# Patient Record
Sex: Female | Born: 1985 | Hispanic: Yes | Marital: Single | Smoking: Never smoker
Health system: Southern US, Community
[De-identification: ages and names within clinical notes are randomized; demographics above are authoritative.]

## PROBLEM LIST (undated history)

## (undated) DIAGNOSIS — Z789 Other specified health status: Secondary | ICD-10-CM

## (undated) HISTORY — PX: COLPOSCOPY: SHX161

---

## 2006-12-25 ENCOUNTER — Inpatient Hospital Stay (HOSPITAL_COMMUNITY): Admission: AD | Admit: 2006-12-25 | Discharge: 2006-12-25 | Payer: Self-pay | Admitting: Obstetrics & Gynecology

## 2007-04-04 ENCOUNTER — Ambulatory Visit (HOSPITAL_COMMUNITY): Admission: RE | Admit: 2007-04-04 | Discharge: 2007-04-04 | Payer: Self-pay | Admitting: Obstetrics & Gynecology

## 2007-05-26 ENCOUNTER — Ambulatory Visit: Payer: Self-pay | Admitting: Obstetrics & Gynecology

## 2007-08-20 ENCOUNTER — Ambulatory Visit: Payer: Self-pay | Admitting: Family

## 2007-08-20 ENCOUNTER — Inpatient Hospital Stay (HOSPITAL_COMMUNITY): Admission: AD | Admit: 2007-08-20 | Discharge: 2007-08-22 | Payer: Self-pay | Admitting: Obstetrics & Gynecology

## 2007-08-25 ENCOUNTER — Ambulatory Visit: Payer: Self-pay | Admitting: Family Medicine

## 2007-08-25 DIAGNOSIS — O8823 Thromboembolism in the puerperium: Secondary | ICD-10-CM | POA: Insufficient documentation

## 2007-08-25 LAB — CONVERTED CEMR LAB: INR: 2.4

## 2007-09-01 ENCOUNTER — Encounter: Payer: Self-pay | Admitting: Family Medicine

## 2010-02-06 ENCOUNTER — Emergency Department (HOSPITAL_COMMUNITY)
Admission: EM | Admit: 2010-02-06 | Discharge: 2010-02-07 | Payer: Self-pay | Source: Home / Self Care | Admitting: Emergency Medicine

## 2010-12-04 LAB — POCT URINALYSIS DIP (DEVICE)
Hgb urine dipstick: NEGATIVE
Ketones, ur: NEGATIVE
Nitrite: NEGATIVE
Protein, ur: NEGATIVE
Urobilinogen, UA: 0.2

## 2010-12-07 LAB — CBC
HCT: 37.7
HCT: 41.3
Hemoglobin: 12.8
MCHC: 33.5
MCHC: 33.9
MCV: 81
Platelets: 215
Platelets: 225
RBC: 4.65
RDW: 16.6 — ABNORMAL HIGH
WBC: 8.6

## 2010-12-07 LAB — PROTIME-INR: Prothrombin Time: 12.3

## 2010-12-20 LAB — CBC
HCT: 41.4
Hemoglobin: 14.4
Platelets: 359
RDW: 13.3
WBC: 9.3

## 2010-12-20 LAB — URINE MICROSCOPIC-ADD ON

## 2010-12-20 LAB — URINALYSIS, ROUTINE W REFLEX MICROSCOPIC
Hgb urine dipstick: NEGATIVE
Ketones, ur: 15 — AB
Protein, ur: 30 — AB
Urobilinogen, UA: 1

## 2010-12-20 LAB — RPR: RPR Ser Ql: NONREACTIVE

## 2010-12-20 LAB — HCG, QUANTITATIVE, PREGNANCY: hCG, Beta Chain, Quant, S: 40289 — ABNORMAL HIGH

## 2010-12-20 LAB — WET PREP, GENITAL
Trich, Wet Prep: NONE SEEN
Yeast Wet Prep HPF POC: NONE SEEN

## 2010-12-20 LAB — POCT PREGNANCY, URINE: Operator id: 134651

## 2010-12-20 LAB — GC/CHLAMYDIA PROBE AMP, GENITAL: GC Probe Amp, Genital: NEGATIVE

## 2014-07-20 ENCOUNTER — Other Ambulatory Visit: Payer: Self-pay | Admitting: Nurse Practitioner

## 2014-07-20 DIAGNOSIS — N6310 Unspecified lump in the right breast, unspecified quadrant: Secondary | ICD-10-CM

## 2014-07-23 ENCOUNTER — Other Ambulatory Visit: Payer: Self-pay

## 2014-08-02 ENCOUNTER — Other Ambulatory Visit (HOSPITAL_COMMUNITY): Payer: Self-pay | Admitting: *Deleted

## 2014-08-02 DIAGNOSIS — N6310 Unspecified lump in the right breast, unspecified quadrant: Secondary | ICD-10-CM

## 2014-08-19 ENCOUNTER — Ambulatory Visit
Admission: RE | Admit: 2014-08-19 | Discharge: 2014-08-19 | Disposition: A | Discharge status: Home / Self Care | Payer: No Typology Code available for payment source | Source: Ambulatory Visit | Attending: Obstetrics and Gynecology | Admitting: Obstetrics and Gynecology

## 2014-08-19 ENCOUNTER — Ambulatory Visit (HOSPITAL_COMMUNITY)
Admission: RE | Admit: 2014-08-19 | Discharge: 2014-08-19 | Disposition: A | Discharge status: Home / Self Care | Payer: Self-pay | Source: Ambulatory Visit | Attending: Obstetrics and Gynecology | Admitting: Obstetrics and Gynecology

## 2014-08-19 ENCOUNTER — Encounter (HOSPITAL_COMMUNITY): Payer: Self-pay

## 2014-08-19 VITALS — BP 100/62 | Temp 98.9°F | Ht 60.0 in | Wt 122.0 lb

## 2014-08-19 DIAGNOSIS — N6310 Unspecified lump in the right breast, unspecified quadrant: Secondary | ICD-10-CM

## 2014-08-19 DIAGNOSIS — Z1239 Encounter for other screening for malignant neoplasm of breast: Secondary | ICD-10-CM

## 2014-08-19 DIAGNOSIS — N6311 Unspecified lump in the right breast, upper outer quadrant: Secondary | ICD-10-CM

## 2014-08-19 NOTE — Progress Notes (Signed)
CLINIC:  [facility] [employer]) Clinic  REASON FOR VISIT: Well-woman exam and diagnostic mammogram.    HISTORY OF PRESENT ILLNESS:  Ms. Tabitha Shepard is a 29 y.o. female who presents to the [facility] today for clinical breast exam. About 2 months ago, she noted a mass in the upper outer quadrant of the right breast.  There is no associated pain, nipple discharge, redness, or skin changes noted to either breast.  No family history of breast cancer.  Her last pap smear was in 07/2014 and was negative.  She does have a history of an abnormal pap smear with colposcopy about 3 years ago.    REVIEW OF SYSTEMS:  Patient self-reported right breast lump per HPI.  No other breast concerns in either breast.   ALLERGIES: No Known Allergies  CURRENT MEDICATIONS:  No current outpatient prescriptions on file prior to encounter.   No current facility-administered medications on file prior to encounter.      PHYSICAL EXAM:  Vitals:  Filed Vitals:   08/19/14 1324  BP: 100/62  Temp: 98.9 F (37.2 C)   General: Well-nourished, well-appearing female in no acute distress.  She is unaccompanied in clinic today.  Tabitha Bang, LPN and Tabitha Shepard, Spanish language interpreter were present during physical exam for this patient.  Breasts: Bilateral breasts exposed and observed with patient standing (arms at side, arms on hips, arms on hips flexed forward, and arms over head).  No gross abnormalities including breast skin puckering or dimpling noted on observation.  Breasts symmetrical without evidence of skin redness, thickening, or peau d'orange appearance. No nipple retraction or nipple discharge noted bilaterally.  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  Normal fibrocystic changes noted in bilateral breasts.  There is a very small, non-discrete, sub-centimeter linear area in the right breast at 10 o'clock, about 6 cm from nipple. No additional  breast nodularity palpated in bilateral breasts.   Axillary lymph nodes: No axillary lymphadenopathy bilaterally.   GU: Exam deferred. Pap smear is up-to-date.  ASSESSMENT & PLAN:   1. Breast cancer screening: Tabitha Shepard has a likely benign finding in the right breast on her clinical breast exam today.  This likely represents normal fibrocystic changes or a fatty deposit.  There is a low suspicion for cancer regarding this area in the right breast. However, she will receive her diagnostic mammogram as scheduled.  She will be contracted by the imaging center for results of the mammogram, either by letter or phone within the next few weeks.  She was given instructions and educational materials regarding breast self-awareness. Tabitha Shepard is aware of this plan and agrees with it. .    Tabitha Shepard was encouraged to ask questions and all questions were answered to her satisfaction.    Lubertha Basque, NP [facility]  [phone]

## 2014-09-03 ENCOUNTER — Encounter (HOSPITAL_COMMUNITY): Payer: Self-pay | Admitting: *Deleted

## 2014-09-06 ENCOUNTER — Encounter (HOSPITAL_COMMUNITY): Payer: Self-pay | Admitting: *Deleted

## 2016-03-01 LAB — OB RESULTS CONSOLE HEPATITIS B SURFACE ANTIGEN: HEP B S AG: NEGATIVE

## 2016-03-01 LAB — OB RESULTS CONSOLE HIV ANTIBODY (ROUTINE TESTING): HIV: NONREACTIVE

## 2016-03-01 LAB — OB RESULTS CONSOLE RPR: RPR: NONREACTIVE

## 2016-03-01 LAB — OB RESULTS CONSOLE RUBELLA ANTIBODY, IGM: RUBELLA: IMMUNE

## 2016-03-01 LAB — OB RESULTS CONSOLE ABO/RH: RH TYPE: POSITIVE

## 2016-03-02 ENCOUNTER — Other Ambulatory Visit (HOSPITAL_COMMUNITY): Payer: Self-pay | Admitting: Nurse Practitioner

## 2016-03-02 DIAGNOSIS — Z3A12 12 weeks gestation of pregnancy: Secondary | ICD-10-CM

## 2016-03-02 DIAGNOSIS — Z3682 Encounter for antenatal screening for nuchal translucency: Secondary | ICD-10-CM

## 2016-03-12 NOTE — L&D Delivery Note (Signed)
Patient is 31 y.o. [id number] 3719w1d admitted for PROM. S/p IOL with foley bulb, followed by Pitocin.   Prenatal course uncomplicated.  Delivery Note At 4:13 PM a viable female was delivered via VBAC, Spontaneous (Presentation: CephalicROA ;  ).  APGAR: 9, 9; weight Pending.   Placenta status: intact , spontaneous .  Cord: 3 vessel with the following complications: .   Anesthesia:  Epidural Episiotomy: None Lacerations: None Suture Repair: None Est. Blood Loss (mL): 100  Mom to postpartum.  Baby to Couplet care / Skin to Skin.  Denyse AmassCorey P Terin Dierolf 10/20/2016, 4:30 PM     Upon arrival patient was complete and pushing. She pushed with good maternal effort to deliver a viable Female infant in cephalic, ROA position. Nuchal cord present x1, easily reduced. Baby delivered without difficulty, was noted to have good tone and place on maternal abdomen for oral suctioning, drying and stimulation. Delayed cord clamping performed. Placenta delivered spontaneously with gentle cord traction. Fundus firm with massage and Pitocin. Perineum inspected and found to have no laceration Counts of sharps, instruments, and lap pads were all correct.

## 2016-03-30 ENCOUNTER — Encounter (HOSPITAL_COMMUNITY): Payer: Self-pay | Admitting: Nurse Practitioner

## 2016-04-09 ENCOUNTER — Encounter (HOSPITAL_COMMUNITY): Payer: Self-pay | Admitting: *Deleted

## 2016-04-10 ENCOUNTER — Other Ambulatory Visit (HOSPITAL_COMMUNITY): Payer: Self-pay | Admitting: Nurse Practitioner

## 2016-04-10 ENCOUNTER — Encounter (HOSPITAL_COMMUNITY): Payer: Self-pay

## 2016-04-10 ENCOUNTER — Ambulatory Visit (HOSPITAL_COMMUNITY)
Admission: RE | Admit: 2016-04-10 | Discharge: 2016-04-10 | Disposition: A | Discharge status: Home / Self Care | Payer: No Typology Code available for payment source | Source: Ambulatory Visit | Attending: Nurse Practitioner | Admitting: Nurse Practitioner

## 2016-04-10 DIAGNOSIS — Z3A12 12 weeks gestation of pregnancy: Secondary | ICD-10-CM | POA: Insufficient documentation

## 2016-04-10 DIAGNOSIS — Z3682 Encounter for antenatal screening for nuchal translucency: Secondary | ICD-10-CM

## 2016-04-10 DIAGNOSIS — O34211 Maternal care for low transverse scar from previous cesarean delivery: Secondary | ICD-10-CM | POA: Insufficient documentation

## 2016-04-10 DIAGNOSIS — O34219 Maternal care for unspecified type scar from previous cesarean delivery: Secondary | ICD-10-CM

## 2016-04-10 HISTORY — DX: Other specified health status: Z78.9

## 2016-04-13 ENCOUNTER — Other Ambulatory Visit: Payer: Self-pay

## 2016-09-24 LAB — OB RESULTS CONSOLE GBS: GBS: NEGATIVE

## 2016-09-24 LAB — OB RESULTS CONSOLE GC/CHLAMYDIA
CHLAMYDIA, DNA PROBE: NEGATIVE
Gonorrhea: NEGATIVE

## 2016-10-19 ENCOUNTER — Inpatient Hospital Stay (HOSPITAL_COMMUNITY)
Admission: AD | Admit: 2016-10-19 | Discharge: 2016-10-21 | DRG: 775 | Disposition: A | Discharge status: Home / Self Care | Payer: Medicaid Other | Source: Ambulatory Visit | Attending: Family Medicine | Admitting: Family Medicine

## 2016-10-19 ENCOUNTER — Encounter (HOSPITAL_COMMUNITY): Payer: Self-pay | Admitting: *Deleted

## 2016-10-19 DIAGNOSIS — O34211 Maternal care for low transverse scar from previous cesarean delivery: Secondary | ICD-10-CM | POA: Diagnosis present

## 2016-10-19 DIAGNOSIS — Z3A40 40 weeks gestation of pregnancy: Secondary | ICD-10-CM | POA: Diagnosis not present

## 2016-10-19 DIAGNOSIS — O4202 Full-term premature rupture of membranes, onset of labor within 24 hours of rupture: Secondary | ICD-10-CM | POA: Diagnosis present

## 2016-10-19 LAB — CBC
HEMATOCRIT: 40.1 % (ref 36.0–46.0)
HEMOGLOBIN: 13.4 g/dL (ref 12.0–15.0)
MCH: 27.9 pg (ref 26.0–34.0)
MCHC: 33.4 g/dL (ref 30.0–36.0)
MCV: 83.4 fL (ref 78.0–100.0)
Platelets: 225 10*3/uL (ref 150–400)
RBC: 4.81 MIL/uL (ref 3.87–5.11)
RDW: 16.4 % — ABNORMAL HIGH (ref 11.5–15.5)
WBC: 8.2 10*3/uL (ref 4.0–10.5)

## 2016-10-19 LAB — TYPE AND SCREEN
ABO/RH(D): A POS
ANTIBODY SCREEN: NEGATIVE

## 2016-10-19 LAB — AMNISURE RUPTURE OF MEMBRANE (ROM) NOT AT ARMC: AMNISURE: POSITIVE

## 2016-10-19 LAB — POCT FERN TEST: POCT Fern Test: NEGATIVE

## 2016-10-19 MED ORDER — OXYTOCIN BOLUS FROM INFUSION
500.0000 mL | Freq: Once | INTRAVENOUS | Status: AC
  Administered 2016-10-20: 500 mL via INTRAVENOUS

## 2016-10-19 MED ORDER — OXYCODONE-ACETAMINOPHEN 5-325 MG PO TABS: 2.0000 | ORAL_TABLET | ORAL | Status: DC | PRN

## 2016-10-19 MED ORDER — ACETAMINOPHEN 325 MG PO TABS: 650.0000 mg | ORAL_TABLET | ORAL | Status: DC | PRN

## 2016-10-19 MED ORDER — ONDANSETRON HCL 4 MG/2ML IJ SOLN: 4.0000 mg | Freq: Four times a day (QID) | INTRAMUSCULAR | Status: DC | PRN

## 2016-10-19 MED ORDER — SOD CITRATE-CITRIC ACID 500-334 MG/5ML PO SOLN: 30.0000 mL | ORAL | Status: DC | PRN

## 2016-10-19 MED ORDER — LACTATED RINGERS IV SOLN: 500.0000 mL | INTRAVENOUS | Status: DC | PRN

## 2016-10-19 MED ORDER — FLEET ENEMA 7-19 GM/118ML RE ENEM: 1.0000 | ENEMA | RECTAL | Status: DC | PRN

## 2016-10-19 MED ORDER — OXYCODONE-ACETAMINOPHEN 5-325 MG PO TABS: 1.0000 | ORAL_TABLET | ORAL | Status: DC | PRN

## 2016-10-19 MED ORDER — OXYTOCIN 40 UNITS IN LACTATED RINGERS INFUSION - SIMPLE MED
2.5000 [IU]/h | INTRAVENOUS | Status: DC
  Administered 2016-10-20: 2.5 [IU]/h via INTRAVENOUS

## 2016-10-19 MED ORDER — LACTATED RINGERS IV SOLN
INTRAVENOUS | Status: DC
  Administered 2016-10-19 – 2016-10-20 (×4): via INTRAVENOUS

## 2016-10-19 MED ORDER — LIDOCAINE HCL (PF) 1 % IJ SOLN
30.0000 mL | INTRAMUSCULAR | Status: DC | PRN
  Filled 2016-10-19: qty 30

## 2016-10-19 NOTE — MAU Note (Signed)
[facility]

## 2016-10-19 NOTE — Anesthesia Pain Management Evaluation Note (Signed)
  CRNA Pain Management Visit Note  Patient: Tabitha Shepard, 31 y.o., female  "Hello I am a member of the anesthesia team at [facility]. We have an anesthesia team available at all times to provide care throughout the hospital, including epidural management and anesthesia for C-section. I don't know your plan for the delivery whether it a natural birth, water birth, IV sedation, nitrous supplementation, doula or epidural, but we want to meet your pain goals."   1.Was your pain managed to your expectations on prior hospitalizations?   Yes   2.What is your expectation for pain management during this hospitalization?     Epidural  3.How can we help you reach that goal? Epidural when desired.  Record the patient's initial score and the patient's pain goal.   Pain: 2  Pain Goal: 7 [facility] wants you to be able to say your pain was always managed very well.  Melvern Ramone 10/19/2016

## 2016-10-19 NOTE — H&P (Signed)
OBSTETRIC ADMISSION HISTORY AND PHYSICAL  Tabitha Shepard is a 31 y.o. female G3P2002 with IUP at [redacted]w[redacted]d by LMP presenting for PROM at 2000 10/18/2016. She reports +FMs,no VB, no blurry vision, headaches or peripheral edema, and RUQ pain.  She plans on breast and bottle feeding. She request nothing for birth control. She received her prenatal care at [facility], and her hx is significant for prev C/S followed by a vag del.  Dating: By LMP --->  Estimated Date of Delivery: 10/19/16  Sono:    @[redacted]w[redacted]d , CWD, normal anatomy, breech presentation,  290g, 43.9% EFW   Prenatal History/Complications:  Past Medical History: Past Medical History:  Diagnosis Date  . Medical history non-contributory     Past Surgical History: Past Surgical History:  Procedure Laterality Date  . CESAREAN SECTION    . COLPOSCOPY      Obstetrical History: OB History    Gravida Para Term Preterm AB Living   3 2 2     2    SAB TAB Ectopic Multiple Live Births                  Social History: Social History   Social History  . Marital status: Single    Spouse name: N/A  . Number of children: N/A  . Years of education: N/A   Social History Main Topics  . Smoking status: Never Smoker  . Smokeless tobacco: Never Used  . Alcohol use No  . Drug use: No  . Sexual activity: Yes   Other Topics Concern  . None   Social History Narrative  . None    Family History: History reviewed. No pertinent family history.  Allergies: No Known Allergies  Prescriptions Prior to Admission  Medication Sig Dispense Refill Last Dose  . Prenatal Vit-Fe Fumarate-FA (PRENATAL VITAMIN PO) Take 1 tablet by mouth daily.    10/18/2016 at Unknown time     Review of Systems   All systems reviewed and negative except as stated in HPI  Blood pressure 107/60, pulse 89, temperature 98.6 F (37 C), temperature source Oral, resp. rate 18, weight 65.4 kg (144 lb 4 oz), last menstrual period 10/28/2015, SpO2 100 %. General  appearance: alert, cooperative and no distress Lungs: clear to auscultation bilaterally Heart: regular rate and rhythm Abdomen: soft, non-tender; bowel sounds normal Pelvic: deferred Extremities: Homans sign is negative, no sign of DVT Presentation: cephalic Fetal monitoringBaseline: 135 bpm, Variability: Good {> 6 bpm), Accelerations: Reactive and Decelerations: Absent Uterine activityFrequency: Every 8-9 minutes and Duration: 60 seconds Dilation: 1 Effacement (%): 50 Exam by::  ([facility])   Prenatal labs: ABO, Rh: --/--/A POS (08/10 1340) Antibody: NEG (08/10 1340) Rubella:  Immune RPR:   negative HBsAg:   negative HIV:   negative GBS: Negative (07/16 0000)  1 hr Glucola reported wnl pg 12 of 09/02/16 records  Genetic screening  normal Anatomy US normal  Prenatal Transfer Tool  Maternal Diabetes: No Genetic Screening: Normal Maternal Ultrasounds/Referrals: Normal - see page 48 of 07/20/2016 scans Fetal Ultrasounds or other Referrals:  None Maternal Substance Abuse:  No Significant Maternal Medications:  None Significant Maternal Lab Results: Lab values include: Group B Strep negative  Results for orders placed or performed during the hospital encounter of 10/19/16 (from the past 24 hour(s))  POCT fern test   Collection Time: 10/19/16 12:10 PM  Result Value Ref Range   POCT Fern Test Negative = intact amniotic membranes   Amnisure rupture of membrane (rom)not at [facility]  Time: 10/19/16 12:28 PM  Result Value Ref Range   Amnisure ROM POSITIVE   CBC   Collection Time: 10/19/16  1:40 PM  Result Value Ref Range   WBC 8.2 4.0 - 10.5 K/uL   RBC 4.81 3.87 - 5.11 MIL/uL   Hemoglobin 13.4 12.0 - 15.0 g/dL   HCT 81.140.1 91.436.0 - 78.246.0 %   MCV 83.4 78.0 - 100.0 fL   MCH 27.9 26.0 - 34.0 pg   MCHC 33.4 30.0 - 36.0 g/dL   RDW 95.616.4 (H) 21.311.5 - 08.615.5 %   Platelets 225 150 - 400 K/uL  Type and screen [facility]   Collection Time: 10/19/16  1:40 PM  Result  Value Ref Range   ABO/RH(D) A POS    Antibody Screen NEG    Sample Expiration 10/22/2016     Patient Active Problem List   Diagnosis Date Noted  . Indication for care in labor or delivery 10/19/2016  . OBSTETRICAL BLOOD-CLOT EMBOLISM POSTPARTUM 08/25/2007    Assessment/Plan:  Tabitha Shepard is a 31 y.o. G3P2002 at 7599w0d here for PROM, not in active labor   #Labor: not in active labor, will place foley and continue to augment as necessary  #Pain: Nothing for now, plans on having epidural placed  #FWB: Cat 1  #ID:  n/a #MOF: breast and bottle  #MOC:none    Tabitha MarvelKendrick C White, MD  10/19/2016, 8:30 PM   CNM attestation:  I have seen and examined this patient; I agree with above documentation in the resident's note.   Tabitha Shepard is a 31 y.o. [id number] here for IOL due to PROM; plans TOLAC  PE: BP 100/67   Pulse 70   Temp 97.7 F (36.5 C) (Oral)   Resp 20   Ht 4\' 9"  (1.448 m)   Wt 65.3 kg (144 lb)   LMP 10/28/2015 (Approximate)   SpO2 99%   BMI 31.16 kg/m  Gen: calm comfortable, NAD Resp: normal effort, no distress Abd: gravid  ROS, labs, PMH reviewed  [facility]: [facility] Plan on cervical foley placement, and Pit to follow Anticipate successful VBAC  Cam HaiSHAW, Alizaya Oshea CNM 10/20/2016, 9:26 AM

## 2016-10-19 NOTE — MAU Note (Signed)
Sent from [facility], ? ROM .  +fern, small amt of pooling noted on spec exam.

## 2016-10-20 ENCOUNTER — Encounter (HOSPITAL_COMMUNITY): Payer: Self-pay | Admitting: Anesthesiology

## 2016-10-20 ENCOUNTER — Inpatient Hospital Stay (HOSPITAL_COMMUNITY): Payer: Medicaid Other | Admitting: Anesthesiology

## 2016-10-20 DIAGNOSIS — Z3A40 40 weeks gestation of pregnancy: Secondary | ICD-10-CM

## 2016-10-20 LAB — RPR: RPR Ser Ql: NONREACTIVE

## 2016-10-20 MED ORDER — MEASLES, MUMPS & RUBELLA VAC ~~LOC~~ INJ
0.5000 mL | INJECTION | Freq: Once | SUBCUTANEOUS | Status: DC
  Filled 2016-10-20: qty 0.5

## 2016-10-20 MED ORDER — OXYCODONE-ACETAMINOPHEN 5-325 MG PO TABS: 2.0000 | ORAL_TABLET | ORAL | Status: DC | PRN

## 2016-10-20 MED ORDER — DIPHENHYDRAMINE HCL 25 MG PO CAPS: 25.0000 mg | ORAL_CAPSULE | Freq: Four times a day (QID) | ORAL | Status: DC | PRN

## 2016-10-20 MED ORDER — ONDANSETRON HCL 4 MG PO TABS: 4.0000 mg | ORAL_TABLET | ORAL | Status: DC | PRN

## 2016-10-20 MED ORDER — PHENYLEPHRINE 40 MCG/ML (10ML) SYRINGE FOR IV PUSH (FOR BLOOD PRESSURE SUPPORT): 80.0000 ug | PREFILLED_SYRINGE | INTRAVENOUS | Status: DC | PRN

## 2016-10-20 MED ORDER — ONDANSETRON HCL 4 MG/2ML IJ SOLN: 4.0000 mg | INTRAMUSCULAR | Status: DC | PRN

## 2016-10-20 MED ORDER — DIPHENHYDRAMINE HCL 50 MG/ML IJ SOLN: 12.5000 mg | INTRAMUSCULAR | Status: DC | PRN

## 2016-10-20 MED ORDER — DOCUSATE SODIUM 100 MG PO CAPS
100.0000 mg | ORAL_CAPSULE | Freq: Two times a day (BID) | ORAL | Status: DC
  Administered 2016-10-20 – 2016-10-21 (×2): 100 mg via ORAL
  Filled 2016-10-20 (×2): qty 1

## 2016-10-20 MED ORDER — SODIUM CHLORIDE 0.9% FLUSH: 3.0000 mL | INTRAVENOUS | Status: DC | PRN

## 2016-10-20 MED ORDER — SODIUM CHLORIDE 0.9 % IV SOLN: 250.0000 mL | INTRAVENOUS | Status: DC | PRN

## 2016-10-20 MED ORDER — OXYCODONE-ACETAMINOPHEN 5-325 MG PO TABS: 1.0000 | ORAL_TABLET | ORAL | Status: DC | PRN

## 2016-10-20 MED ORDER — EPHEDRINE 5 MG/ML INJ: 10.0000 mg | INTRAVENOUS | Status: DC | PRN

## 2016-10-20 MED ORDER — ZOLPIDEM TARTRATE 5 MG PO TABS: 5.0000 mg | ORAL_TABLET | Freq: Every evening | ORAL | Status: DC | PRN

## 2016-10-20 MED ORDER — COCONUT OIL OIL: 1.0000 "application " | TOPICAL_OIL | Status: DC | PRN

## 2016-10-20 MED ORDER — BENZOCAINE-MENTHOL 20-0.5 % EX AERO
1.0000 "application " | INHALATION_SPRAY | CUTANEOUS | Status: DC | PRN
  Filled 2016-10-20: qty 56

## 2016-10-20 MED ORDER — SODIUM CHLORIDE 0.9% FLUSH: 3.0000 mL | Freq: Two times a day (BID) | INTRAVENOUS | Status: DC

## 2016-10-20 MED ORDER — IBUPROFEN 600 MG PO TABS
600.0000 mg | ORAL_TABLET | Freq: Four times a day (QID) | ORAL | Status: DC
  Administered 2016-10-20 – 2016-10-21 (×4): 600 mg via ORAL
  Filled 2016-10-20 (×4): qty 1

## 2016-10-20 MED ORDER — OXYTOCIN 40 UNITS IN LACTATED RINGERS INFUSION - SIMPLE MED
1.0000 m[IU]/min | INTRAVENOUS | Status: DC
  Administered 2016-10-20: 2 m[IU]/min via INTRAVENOUS
  Administered 2016-10-20: 4 m[IU]/min via INTRAVENOUS
  Filled 2016-10-20: qty 1000

## 2016-10-20 MED ORDER — FENTANYL 2.5 MCG/ML BUPIVACAINE 1/10 % EPIDURAL INFUSION (WH - ANES)
14.0000 mL/h | INTRAMUSCULAR | Status: DC | PRN
  Administered 2016-10-20: 10.5 mL/h via EPIDURAL
  Filled 2016-10-20: qty 100

## 2016-10-20 MED ORDER — WITCH HAZEL-GLYCERIN EX PADS: 1.0000 "application " | MEDICATED_PAD | CUTANEOUS | Status: DC | PRN

## 2016-10-20 MED ORDER — TETANUS-DIPHTH-ACELL PERTUSSIS 5-2.5-18.5 LF-MCG/0.5 IM SUSP: 0.5000 mL | Freq: Once | INTRAMUSCULAR | Status: DC

## 2016-10-20 MED ORDER — PHENYLEPHRINE 40 MCG/ML (10ML) SYRINGE FOR IV PUSH (FOR BLOOD PRESSURE SUPPORT)
80.0000 ug | PREFILLED_SYRINGE | INTRAVENOUS | Status: DC | PRN
  Filled 2016-10-20: qty 10

## 2016-10-20 MED ORDER — MAGNESIUM HYDROXIDE 400 MG/5ML PO SUSP: 30.0000 mL | ORAL | Status: DC | PRN

## 2016-10-20 MED ORDER — SENNOSIDES-DOCUSATE SODIUM 8.6-50 MG PO TABS
2.0000 | ORAL_TABLET | ORAL | Status: DC
  Administered 2016-10-20: 2 via ORAL
  Filled 2016-10-20: qty 2

## 2016-10-20 MED ORDER — SIMETHICONE 80 MG PO CHEW: 80.0000 mg | CHEWABLE_TABLET | ORAL | Status: DC | PRN

## 2016-10-20 MED ORDER — PRENATAL MULTIVITAMIN CH
1.0000 | ORAL_TABLET | Freq: Every day | ORAL | Status: DC
  Administered 2016-10-21: 1 via ORAL
  Filled 2016-10-20: qty 1

## 2016-10-20 MED ORDER — TERBUTALINE SULFATE 1 MG/ML IJ SOLN: 0.2500 mg | Freq: Once | INTRAMUSCULAR | Status: DC | PRN

## 2016-10-20 MED ORDER — ACETAMINOPHEN 325 MG PO TABS
650.0000 mg | ORAL_TABLET | ORAL | Status: DC | PRN
  Administered 2016-10-20: 650 mg via ORAL
  Filled 2016-10-20: qty 2

## 2016-10-20 MED ORDER — LIDOCAINE HCL (PF) 1 % IJ SOLN
INTRAMUSCULAR | Status: DC | PRN
  Administered 2016-10-20: 3 mL via EPIDURAL
  Administered 2016-10-20: 4 mL via EPIDURAL

## 2016-10-20 MED ORDER — DIBUCAINE 1 % RE OINT: 1.0000 "application " | TOPICAL_OINTMENT | RECTAL | Status: DC | PRN

## 2016-10-20 MED ORDER — LACTATED RINGERS IV SOLN
500.0000 mL | Freq: Once | INTRAVENOUS | Status: AC
  Administered 2016-10-20: 500 mL via INTRAVENOUS

## 2016-10-20 MED ORDER — OXYTOCIN 40 UNITS IN LACTATED RINGERS INFUSION - SIMPLE MED: 2.5000 [IU]/h | INTRAVENOUS | Status: DC | PRN

## 2016-10-20 NOTE — Anesthesia Preprocedure Evaluation (Signed)
Anesthesia Evaluation  Patient identified by MRN, date of birth, ID band Patient awake    Reviewed: Allergy & Precautions, Patient's Chart, lab work & pertinent test results  Airway Mallampati: II  TM Distance: >3 FB Neck ROM: Full    Dental no notable dental hx. (+) Teeth Intact   Pulmonary neg pulmonary ROS,    Pulmonary exam normal breath sounds clear to auscultation       Cardiovascular negative cardio ROS Normal cardiovascular exam Rhythm:Regular Rate:Normal     Neuro/Psych negative neurological ROS  negative psych ROS   GI/Hepatic Neg liver ROS, GERD  ,  Endo/Other  Obesity  Renal/GU negative Renal ROS  negative genitourinary   Musculoskeletal negative musculoskeletal ROS (+)   Abdominal (+) + obese,   Peds  Hematology   Anesthesia Other Findings   Reproductive/Obstetrics (+) Pregnancy Previous C/section with successful VBAC                             Anesthesia Physical Anesthesia Plan  ASA: II  Anesthesia Plan: Epidural   Post-op Pain Management:    Induction:   PONV Risk Score and Plan:   Airway Management Planned: Natural Airway  Additional Equipment:   Intra-op Plan:   Post-operative Plan:   Informed Consent: I have reviewed the patients History and Physical, chart, labs and discussed the procedure including the risks, benefits and alternatives for the proposed anesthesia with the patient or authorized representative who has indicated his/her understanding and acceptance.     Plan Discussed with: Anesthesiologist  Anesthesia Plan Comments:         Anesthesia Quick Evaluation

## 2016-10-20 NOTE — Progress Notes (Signed)
Patient ID: Tabitha Shepard, female   DOB: [birth year], 31 y.o.   MRN: [id number]   Labor Progress Note Tabitha Shepard is a 31 y.o. G3P2002 at 7947w1d presented for PROM at 2000 10/18/2016 S: Patient is doing well, feels regular contractions, pain is manageable as is. Still plans on epidural later on. Foley bulb is out  O:  BP 105/67   Pulse 73   Temp 97.9 F (36.6 C) (Oral)   Resp 16   Ht 4\' 9"  (1.448 m)   Wt 65.3 kg (144 lb)   LMP 10/28/2015 (Approximate)   SpO2 100%   BMI 31.16 kg/m  EFM: 130/moderate variability (was minimal minutes prior)/no decels   CVE: Dilation: 4 Effacement (%): 70 Station: -3 Presentation: Vertex Exam by:: Lanier EnsignSavannah Brendle RN   A&P: 31 y.o. [id number] 5847w1d augmentation of labor for PROM  #Labor: Progressing well.  Now s/p foley bulb will augment as necessary  #Pain: manageable at this moment  #FWB: cat 1 #GBS negative   Ignacia MarvelKendrick C Letetia Romanello, MD 7:03 AM

## 2016-10-20 NOTE — Anesthesia Procedure Notes (Addendum)
Epidural Patient location during procedure: OB Start time: 10/20/2016 8:33 AM  Staffing Anesthesiologist: Mal AmabileFOSTER, Martisha Toulouse  Preanesthetic Checklist Completed: patient identified, site marked, surgical consent, pre-op evaluation, timeout performed, IV checked, risks and benefits discussed and monitors and equipment checked  Epidural Patient position: sitting Prep: site prepped and draped and DuraPrep Patient monitoring: continuous pulse ox and blood pressure Approach: midline Location: L3-L4 Injection technique: LOR air  Needle:  Needle type: Tuohy  Needle gauge: 17 G Needle length: 9 cm and 9 Needle insertion depth: 4 cm Catheter type: closed end flexible Catheter size: 19 Gauge Catheter at skin depth: 9 cm Test dose: negative and Other  Assessment Events: blood not aspirated, injection not painful, no injection resistance, negative IV test and no paresthesia  Additional Notes Patient identified. Risks and benefits discussed including failed block, incomplete  Pain control, post dural puncture headache, nerve damage, paralysis, blood pressure Changes, nausea, vomiting, reactions to medications-both toxic and allergic and post Partum back pain. All questions were answered. Patient expressed understanding and wished to proceed. Sterile technique was used throughout procedure. Epidural site was Dressed with sterile barrier dressing. No paresthesias, signs of intravascular injection Or signs of intrathecal spread were encountered. Spanish interpreter used throughout procedure. Patient was more comfortable after the epidural was dosed. Please see RN's note for documentation of vital signs and FHR which are stable.

## 2016-10-20 NOTE — Progress Notes (Signed)
Patient ID: [name], female   DOB: 1985-11-03, 31 y.o.   MRN: [id number]   Labor Progress Note [name] is a 31 y.o. G3P2002 at 4369w1d presented for PROM at 2000 10/18/2016 S: Feeling contractions regularly.   O:  BP 104/70   Pulse 67   Temp 98.1 F (36.7 C) (Oral)   Resp 18   Ht 4\' 9"  (1.448 m)   Wt 144 lb (65.3 kg)   LMP 10/28/2015 (Approximate)   SpO2 99%   BMI 31.16 kg/m  EFM: 135/moderate variability /Moderare/no decels +accel  CVE: Dilation: 4 Effacement (%): 70 Station: -3 Presentation: Vertex Exam by:: Southern CompanySavannah Brendle RN   A&P: 31 y.o. [id number] 6369w1d augmentation of labor for PROM  #Labor: Progressing well continue pitocin titration as needed #Pain: manageable at this moment  #FWB: cat 1 #GBS negative   John Giovanniorey P Latoshia Monrroy, MD 9:43 AM

## 2016-10-20 NOTE — Progress Notes (Signed)
Patient ID: Tabitha Shepard, female   DOB: 02-20-1986, 31 y.o.   MRN: [id number]   Labor Progress Note Tabitha Shepard is a 31 y.o. G3P2002 at 7385w1d presented for PROM at 2000 10/18/2016 S: Feeling contractions regularly.   O:  BP 101/69   Pulse 75   Temp 97.9 F (36.6 C) (Axillary)   Resp 18   Ht 4\' 9"  (1.448 m)   Wt 144 lb (65.3 kg)   LMP 10/28/2015 (Approximate)   SpO2 99%   BMI 31.16 kg/m  EFM: 135/moderate variability /Moderare/no decels +accel  CVE: Dilation: 6 Effacement (%): 100 Station: -1 Presentation: Vertex Exam by:: Valentina Lucks. Woods, RN   A&P: 31 y.o. [id number] 10285w1d augmentation of labor for PROM  #Labor: Progressing well continue pitocin titration as needed #Pain: IV pain med/ epidural #FWB: cat 1 #GBS negative   John Giovanniorey P Dequita Schleicher, MD 1:03 PM

## 2016-10-20 NOTE — Progress Notes (Signed)
Interpreter in to do admission teaching bulb suction taught and explained. Patient told not to get up without assistance. Emergency light explained and taught..patient stable..Marland Kitchen

## 2016-10-21 ENCOUNTER — Encounter (HOSPITAL_COMMUNITY): Payer: Self-pay | Admitting: *Deleted

## 2016-10-21 MED ORDER — IBUPROFEN 600 MG PO TABS: 600.0000 mg | ORAL_TABLET | Freq: Four times a day (QID) | ORAL | 0 refills | Status: DC

## 2016-10-21 NOTE — Progress Notes (Signed)
DC teaching completed via interpreter

## 2016-10-21 NOTE — Discharge Summary (Signed)
OB Discharge Summary     Patient Name: Tabitha Shepard DOB: 02-16-1986 MRN: [id number]  Date of admission: 10/19/2016 Delivering MD: Reva BoresPRATT, TANYA S   Date of discharge: 10/21/2016  Admitting diagnosis: 40WKS, LEAKING Intrauterine pregnancy: 7562w1d     Secondary diagnosis:  Active Problems:   Indication for care in labor or delivery   NSVD (normal spontaneous vaginal delivery)  Additional problems:  Patient Active Problem List   Diagnosis Date Noted  . NSVD (normal spontaneous vaginal delivery) 10/20/2016  . Indication for care in labor or delivery 10/19/2016  . OBSTETRICAL BLOOD-CLOT EMBOLISM POSTPARTUM 08/25/2007      Discharge diagnosis: Term Pregnancy Delivered and VBAC                                                                                                Post partum procedures:none  Augmentation: Pitocin and Foley Balloon  Complications: None  Hospital course:  Onset of Labor With Vaginal Delivery     31 y.o. yo G3P3003 at 3062w1d was admitted in Latent Labor on 10/19/2016. Patient had an uncomplicated labor course as follows:  Membrane Rupture Time/Date: 8:00 PM ,10/18/2016   Intrapartum Procedures: Episiotomy: None [1]                                         Lacerations:  None [1]  Patient had a delivery of a Viable infant. 10/20/2016  Information for the patient's newborn:  Glynis SmilesDelgado-Navarro, Girl Donata ClayKarina [[id number]][[id number]]  Delivery Method: VBAC, Spontaneous (Filed from Delivery Summary)   Patient admitted for PROM.  Pateint had an uncomplicated postpartum course.  She is ambulating, tolerating a regular diet, passing flatus, and urinating well. Patient is discharged home in stable condition on 10/21/16.   Physical exam  Vitals:   10/20/16 1824 10/20/16 1941 10/20/16 2347 10/21/16 0532  BP: (!) 98/58 99/63 100/60 104/61  Pulse: 63 72 71 63  Resp: 18 18 20 17   Temp: 98.2 F (36.8 C) 98.9 F (37.2 C) 98.7 F (37.1 C) 97.7 F (36.5 C)  TempSrc:  Oral Oral  Oral  SpO2:      Weight:      Height:       General: alert, cooperative and no distress Lochia: appropriate Uterine Fundus: firm Incision: N/A DVT Evaluation: No evidence of DVT seen on physical exam. Negative Homan's sign. No significant calf/ankle edema. Labs: Lab Results  Component Value Date   WBC 8.2 10/19/2016   HGB 13.4 10/19/2016   HCT 40.1 10/19/2016   MCV 83.4 10/19/2016   PLT 225 10/19/2016   No flowsheet data found.  Discharge instruction: per After Visit Summary and "Baby and Me Booklet".  After visit meds:  Allergies as of 10/21/2016   No Known Allergies     Medication List    TAKE these medications   ibuprofen 600 MG tablet Commonly known as:  ADVIL,MOTRIN Take 1 tablet (600 mg total) by mouth every 6 (six) hours.   PRENATAL VITAMIN PO Take 1 tablet by mouth  daily.       Diet: routine diet  Activity: Advance as tolerated. Pelvic rest for 6 weeks.   Outpatient follow up:4-6 weeks Follow up Appt:No future appointments. Follow up Visit:No Follow-up on file.  Postpartum contraception: None  Newborn Data: Live born female  Birth Weight: 6 lb 15.8 oz (3170 g) APGAR: 9, 9  Baby Feeding: Bottle and Breast Disposition:home with mother   10/21/2016 [place] Shirley, DO

## 2016-10-21 NOTE — Progress Notes (Signed)
Interpreter in to answer questions and go over plan of care. Patient expresses want of DC later this evening. Patient's pain is a 2 and bleeding stable.

## 2016-10-21 NOTE — Discharge Instructions (Signed)
Instrucciones para la mamá sobre los cuidados en el hogar °(Home Care Instructions for Mom) °ACTIVIDAD °· Reanude sus actividades regulares de forma gradual. °· Descanse. Tome siestas cuando el bebé duerme. °· No levante objetos que pesen más de 10 libras (4,5 kg) hasta que el médico se lo autorice. °· Evite las actividades que demandan mucho esfuerzo y energía (que son extenuantes) hasta que el médico se lo autorice. Caminar a un ritmo tranquilo a moderado siempre es más seguro. °· Si tuvo un parto por cesárea: °? No pase la aspiradora, suba escaleras o conduzca un vehículo durante 4 o 6 semanas. °? Pídale a alguien que le brinde ayuda con las tareas domésticas hasta que pueda realizarlas por su cuenta. °? Haga ejercicios como se lo haya indicado el médico, si corresponde. ° °HEMORRAGIA VAGINAL °Probablemente continúe sangrando durante 4 o 6 semanas después del parto. Generalmente, la cantidad de sangre disminuye y el color se hace más claro con el transcurso del tiempo. Sin embargo, si usted está demasiado activa, el color de la sangre puede ser rojo brillante. Si necesita cambiarse la compresa higiénica en menos de una hora o tiene coágulos grandes: °· Permanezca acostada. °· Eleve los pies. °· Coloque compresas frías en la zona inferior del abdomen. °· Haga reposo. °· Comuníquese con su médico. °Si está amamantando, podría volver a tener su período entre las 8 semanas después del parto y el momento en que deje de amamantar. Si no está amamantando, volverá a tener su período 6 u 8 semanas después del parto. °CUIDADOS PERINEALES °La zona perineal o perineo, es la parte del cuerpo que se encuentra entre los muslos. Después del parto, esta zona necesita un cuidado especial. Siga las siguientes indicaciones como se lo haya indicado su médico. °· Tome baños de inmersión durante 15 o 20 minutos. °· Utilice apósitos o aerosoles analgésicos y cremas como se lo hayan indicado. °· No utilice tampones ni se haga duchas  vaginales hasta que el sangrado vaginal se haya detenido. °· Cada vez que vaya al baño: °? Use una botella perineal. °? Cámbiese el apósito. °? Use papel tisú en lugar de papel higiénico hasta que se cure la sutura. °· Haga ejercicios de Kegel todos los días. Los ejercicios Kegel ayudan a mantener los músculos que sostienen la vagina, la vejiga y los intestinos. Estos ejercicios se pueden realizar mientras está parada, sentada o acostada. Para hacer los ejercicios de Kegel: °? Tense los músculos del estómago y los que rodean el canal de parto. °? Mantenga esta posición durante unos segundos. °? Relájese. °? Repita hasta hacerlos 5 veces seguidas. °· Para evitar las hemorroides o que estas empeoren: °? Beba suficiente líquido para mantener la orina clara o de color amarillo pálido. °? Evite hacer fuerza al defecar. °? Tome los medicamentos y laxantes de venta libre como se lo haya indicado el médico. °CUIDADO DE LAS MAMAS °· Use un buen sostén. °· Evite tomar analgésicos de venta libre para las molestias de los pechos. °· Aplique hielo en los pechos para aliviar las molestias tanto como sea necesario: °? Ponga el hielo en una bolsa plástica. °? Coloque una toalla entre la piel y la bolsa de hielo. °? Aplique el hielo durante 20, o como se lo haya indicado el médico. ° °NUTRICIÓN °· Mantenga una dieta bien balanceada. °· No intente perder de peso rápidamente reduciendo el consumo de calorías. °· Tome sus vitaminas prenatales hasta el control de postparto o hasta que su médico se lo indique. ° °DEPRESIÓN POSTPARTO °  Puede sentir deseos de llorar sin motivo aparente y verse incapaz de enfrentarse a todos los cambios que implica tener un bebé. Este estado de ánimo se llama depresión postparto. La depresión postparto ocurre porque sus niveles hormonales sufren cambios después del parto. Si usted tiene depresión postparto, busque contención por parte de su pareja, sus amigos y su familia. Si la depresión no desaparece por  sí sola después de algunas semanas, concurra a su médico. °AUTOEXAMEN DE MAMAS °Realícese autoexámenes en el mismo momento cada mes. Si está amamantando, el mejor momento de controlar sus mamas es después de alimentar al bebé, cuando los pechos no están tan llenos. Si está amamantando y su período ya comenzó, controle sus mamas el día 5, 6 o 7 de su período. °Informe a su médico de cualquier protuberancia, bulto o secreción. Si está amamantando, las mamas normalmente tienen bultos. Esto es transitorio y no es un riesgo para la salud. °INTIMIDAD Y SEXUALIDAD °Debe evitar las relaciones sexuales durante al menos 3 o 4 semanas después del parto o hasta que el flujo de color rojo amarronado haya desaparecido completamente. Si no desea quedar embarazada nuevamente, use algún método anticonceptivo. Después del parto, puede quedar embarazada incluso si no ha tenido todavía el período. °SOLICITE ATENCIÓN MÉDICA SI: °· Se siente incapaz de controlar los cambios que implica tener un hijo y esos sentimientos no desaparecen después de algunas semanas. °· Detecta una protuberancia, bulto o secreción en sus mamas. ° °SOLICITE ATENCIÓN MÉDICA DE INMEDIATO SI: °· Debe cambiarse la compresa higiénica en 1 hora o menos. °· Tiene los siguientes síntomas: °? Dolor intenso o calambres en la parte inferior del abdomen. °? Una secreción vaginal con mal olor. °? Fiebre que no se alivia con los medicamentos. °? Una zona de la mama se pone roja y le causa dolor, y además usted tiene fiebre. °? Una pantorrilla enrojecida y con dolor. °? Repentino e intenso dolor en el pecho. °? Falta de aire. °? Micción dolorosa o con sangre. °? Problemas visuales. °· Vómitos durante 12 horas o más. °· Dolor de cabeza intenso. °· Tiene pensamientos serios acerca de lastimarse a usted misma o dañar al niño o a otra persona. ° °Esta información no tiene como fin reemplazar el consejo del médico. Asegúrese de hacerle al médico cualquier pregunta que  tenga. °Document Released: 02/26/2005 Document Revised: 06/20/2015 Document Reviewed: 08/30/2014 °Elsevier Interactive Patient Education © 2017 Elsevier Inc. ° °

## 2016-10-21 NOTE — Progress Notes (Signed)
Post Partum Day 1 Patient seen and examined with an interpreter in the room.  Subjective: no complaints, up ad lib, voiding, tolerating PO, + flatus and Patient would like to go home today if possible  Objective: Blood pressure 104/61, pulse 63, temperature 97.7 F (36.5 C), temperature source Oral, resp. rate 17, height 4\' 9"  (1.448 m), weight 65.3 kg (144 lb), last menstrual period 10/28/2015, SpO2 99 %, unknown if currently breastfeeding.  Physical Exam:  General: alert, cooperative and no distress Lochia: appropriate Uterine Fundus: firm Incision: n/a DVT Evaluation: No evidence of DVT seen on physical exam. Negative Homan's sign. No significant calf/ankle edema.   Recent Labs  10/19/16 1340  HGB 13.4  HCT 40.1    Assessment/Plan: Discharge home if baby is discharged   LOS: 2 days   Tabitha Shepard 10/21/2016, 7:38 AM

## 2016-10-21 NOTE — Anesthesia Postprocedure Evaluation (Signed)
Anesthesia Post Note  Patient: Tabitha Shepard  Procedure(s) Performed: * No procedures listed *     Patient location during evaluation: Mother Baby Anesthesia Type: Epidural Level of consciousness: awake and alert, oriented and patient cooperative Pain management: pain level controlled Vital Signs Assessment: post-procedure vital signs reviewed and stable Respiratory status: spontaneous breathing Cardiovascular status: stable Postop Assessment: no headache, epidural receding, patient able to bend at knees and no signs of nausea or vomiting Anesthetic complications: no Comments: Pain score 0.    Last Vitals:  Vitals:   10/20/16 2347 10/21/16 0532  BP: 100/60 104/61  Pulse: 71 63  Resp: 20 17  Temp: 37.1 C 36.5 C  SpO2:      Last Pain:  Vitals:   10/21/16 0752  TempSrc:   PainSc: 2    Pain Goal: Patients Stated Pain Goal: 6 (10/20/16 0733)               Merrilyn PumaWRINKLE,Nyshaun Standage

## 2016-11-07 NOTE — Progress Notes (Signed)
Post discharge chart review completed.  

## 2017-01-15 ENCOUNTER — Telehealth: Payer: Self-pay | Admitting: *Deleted

## 2017-01-15 ENCOUNTER — Encounter (HOSPITAL_COMMUNITY): Payer: Self-pay | Admitting: Emergency Medicine

## 2017-01-15 ENCOUNTER — Emergency Department (HOSPITAL_COMMUNITY)
Admission: EM | Admit: 2017-01-15 | Discharge: 2017-01-15 | Disposition: A | Discharge status: Home / Self Care | Payer: No Typology Code available for payment source | Attending: Emergency Medicine | Admitting: Emergency Medicine

## 2017-01-15 ENCOUNTER — Emergency Department (HOSPITAL_COMMUNITY): Payer: No Typology Code available for payment source

## 2017-01-15 ENCOUNTER — Other Ambulatory Visit: Payer: Self-pay

## 2017-01-15 DIAGNOSIS — R1013 Epigastric pain: Secondary | ICD-10-CM

## 2017-01-15 DIAGNOSIS — K8050 Calculus of bile duct without cholangitis or cholecystitis without obstruction: Secondary | ICD-10-CM | POA: Insufficient documentation

## 2017-01-15 LAB — URINALYSIS, ROUTINE W REFLEX MICROSCOPIC
BILIRUBIN URINE: NEGATIVE
Glucose, UA: NEGATIVE mg/dL
KETONES UR: NEGATIVE mg/dL
NITRITE: NEGATIVE
PROTEIN: NEGATIVE mg/dL
Specific Gravity, Urine: 1.013 (ref 1.005–1.030)
pH: 6 (ref 5.0–8.0)

## 2017-01-15 LAB — CBC
HEMATOCRIT: 42.1 % (ref 36.0–46.0)
Hemoglobin: 14.1 g/dL (ref 12.0–15.0)
MCH: 29.1 pg (ref 26.0–34.0)
MCHC: 33.5 g/dL (ref 30.0–36.0)
MCV: 87 fL (ref 78.0–100.0)
PLATELETS: 306 10*3/uL (ref 150–400)
RBC: 4.84 MIL/uL (ref 3.87–5.11)
RDW: 13.8 % (ref 11.5–15.5)
WBC: 10.8 10*3/uL — AB (ref 4.0–10.5)

## 2017-01-15 LAB — COMPREHENSIVE METABOLIC PANEL
ALT: 60 U/L — ABNORMAL HIGH (ref 14–54)
AST: 128 U/L — ABNORMAL HIGH (ref 15–41)
Albumin: 4.1 g/dL (ref 3.5–5.0)
Alkaline Phosphatase: 109 U/L (ref 38–126)
Anion gap: 9 (ref 5–15)
BUN: 12 mg/dL (ref 6–20)
CHLORIDE: 108 mmol/L (ref 101–111)
CO2: 20 mmol/L — ABNORMAL LOW (ref 22–32)
Calcium: 9.1 mg/dL (ref 8.9–10.3)
Creatinine, Ser: 0.56 mg/dL (ref 0.44–1.00)
Glucose, Bld: 112 mg/dL — ABNORMAL HIGH (ref 65–99)
POTASSIUM: 3.2 mmol/L — AB (ref 3.5–5.1)
Sodium: 137 mmol/L (ref 135–145)
TOTAL PROTEIN: 7.5 g/dL (ref 6.5–8.1)
Total Bilirubin: 1 mg/dL (ref 0.3–1.2)

## 2017-01-15 LAB — LIPASE, BLOOD: LIPASE: 36 U/L (ref 11–51)

## 2017-01-15 MED ORDER — OXYCODONE-ACETAMINOPHEN 5-325 MG PO TABS
1.0000 | ORAL_TABLET | Freq: Once | ORAL | Status: DC
  Filled 2017-01-15: qty 1

## 2017-01-15 MED ORDER — OXYCODONE-ACETAMINOPHEN 5-325 MG PO TABS: 1.0000 | ORAL_TABLET | Freq: Four times a day (QID) | ORAL | 0 refills | Status: DC | PRN

## 2017-01-15 NOTE — ED Triage Notes (Signed)
Pt c/o epigastric pain 2/10 on ED arrival, vomiting x 1, 4 months post partum.

## 2017-01-15 NOTE — Telephone Encounter (Signed)
Pharmacy called related to Rx: percocet directions .Marland Kitchen.Marland Kitchen.EDCM clarified with EDP (Hedges) to change Rx to: take 1 tablet every 6 hours as needed for pain.   Verneda Hollopeter J. Lucretia RoersWood, RN, BSN, [place] [phone]

## 2017-01-15 NOTE — ED Notes (Signed)
Patient transported to Ultrasound 

## 2017-01-15 NOTE — Discharge Instructions (Signed)
If you have worsening pain, you need to be reevaluated immediately.  Follow-up with general surgery to have your gallbladder removed electively.

## 2017-01-15 NOTE — ED Provider Notes (Signed)
[facility]   CSN: [id number] Arrival date & time: 01/15/17  0436     History   Chief Complaint Chief Complaint  Patient presents with  . Abdominal Pain    HPI [name] is a 31 y.o. female.  HPI  This is a 31 year old female who presents with epigastric pain.  Patient reports that she woke up from sleep with sharp epigastric pain that was nonradiating.  Pain has since subsided.  It is currently 3 out of 10.  She reports nausea.  No vomiting or diarrhea.  Recent pregnancy that was uncomplicated.  Patient denies any change of pain with eating.  However, she did have similar pains this weekend that mostly occurred at night.  Denies any fevers, chest pain, shortness of breath.  Past Medical History:  Diagnosis Date  . Medical history non-contributory     Patient Active Problem List   Diagnosis Date Noted  . NSVD (normal spontaneous vaginal delivery) 10/20/2016  . Indication for care in labor or delivery 10/19/2016  . OBSTETRICAL BLOOD-CLOT EMBOLISM POSTPARTUM 08/25/2007    Past Surgical History:  Procedure Laterality Date  . CESAREAN SECTION    . COLPOSCOPY      OB History    Gravida Para Term Preterm AB Living   3 3 3     3    SAB TAB Ectopic Multiple Live Births         0 1       Home Medications    Prior to Admission medications   Medication Sig Start Date End Date Taking? Authorizing Provider  ibuprofen (ADVIL,MOTRIN) 600 MG tablet Take 1 tablet (600 mg total) by mouth every 6 (six) hours. Patient not taking: Reported on 01/15/2017 10/21/16   Shirley, [place], DO  oxyCODONE-acetaminophen (PERCOCET/ROXICET) 5-325 MG tablet Take 1-2 tablets every 6 (six) hours as needed by mouth for severe pain. 01/15/17   Horton, Mayer Maskerourtney F, MD    Family History No family history on file.  Social History Social History   Tobacco Use  . Smoking status: Never Smoker  . Smokeless tobacco: Never Used    Substance Use Topics  . Alcohol use: No  . Drug use: No     Allergies   Patient has no known allergies.   Review of Systems Review of Systems  Constitutional: Negative for fever.  Respiratory: Negative for shortness of breath.   Cardiovascular: Negative for chest pain.  Gastrointestinal: Positive for abdominal pain and nausea. Negative for diarrhea and vomiting.  Genitourinary: Negative for dysuria.  All other systems reviewed and are negative.    Physical Exam Updated Vital Signs BP 111/70   Pulse 99   Temp 97.6 F (36.4 C) (Oral)   Resp 17   Ht 4\' 9"  (1.448 m)   Wt 65.3 kg (144 lb)   LMP 01/07/2017   SpO2 99%   BMI 31.16 kg/m   Physical Exam  Constitutional: She is oriented to person, place, and time. She appears well-developed and well-nourished.  HENT:  Head: Normocephalic and atraumatic.  Neck: Neck supple.  Cardiovascular: Normal rate, regular rhythm and normal heart sounds.  Pulmonary/Chest: Effort normal. No respiratory distress. She has no wheezes.  Abdominal: Soft. Bowel sounds are normal. There is no tenderness. There is no rebound, no guarding and negative Murphy's sign.  Neurological: She is alert and oriented to person, place, and time.  Skin: Skin is warm and dry.  Psychiatric: She has a normal mood and affect.  Nursing note and vitals reviewed.    ED Treatments / Results  Labs (all labs ordered are listed, but only abnormal results are displayed) Labs Reviewed  COMPREHENSIVE METABOLIC PANEL - Abnormal; Notable for the following components:      Result Value   Potassium 3.2 (*)    CO2 20 (*)    Glucose, Bld 112 (*)    AST 128 (*)    ALT 60 (*)    All other components within normal limits  CBC - Abnormal; Notable for the following components:   WBC 10.8 (*)    All other components within normal limits  URINALYSIS, ROUTINE W REFLEX MICROSCOPIC - Abnormal; Notable for the following components:   Hgb urine dipstick MODERATE (*)     Leukocytes, UA SMALL (*)    Bacteria, UA RARE (*)    Squamous Epithelial / LPF 0-5 (*)    All other components within normal limits  LIPASE, BLOOD    EKG  EKG Interpretation  Date/Time:  Tuesday January 15 2017 04:43:51 EST Ventricular Rate:  95 PR Interval:    QRS Duration: 94 QT Interval:  354 QTC Calculation: 445 R Axis:   63 Text Interpretation:  Sinus rhythm Anteroseptal infarct, age indeterminate No prior for comparison Confirmed by Ross MarcusHorton, Courtney ([id number]) on 01/15/2017 6:45:06 AM       Radiology [place] Abdomen Limited Ruq  Result Date: 01/15/2017 CLINICAL DATA:  Epigastric pain for 2 days.  nausea and vomiting. EXAM: ULTRASOUND ABDOMEN LIMITED RIGHT UPPER QUADRANT COMPARISON:  None. FINDINGS: Gallbladder: A 7 mm gallbladder calculus is seen. In addition, there is a 5 mm non-mobile echogenic density along the nondependent bladder wall, consistent with a tiny polyp. No abnormal gallbladder wall thickening or pericholecystic fluid. No sonographic Murphy sign noted by sonographer. Common bile duct: Diameter: 3 mm, within normal limits. Liver: No focal lesion identified. Within normal limits in parenchymal echogenicity. Portal vein is patent on color Doppler imaging with normal direction of blood flow towards the liver. IMPRESSION: Cholelithiasis, without sonographic signs of cholecystitis or biliary dilatation . Electronically Signed   By: Myles RosenthalJohn  Stahl M.D.   On: 01/15/2017 07:16    Procedures Procedures (including critical care time)  Medications Ordered in ED Medications  oxyCODONE-acetaminophen (PERCOCET/ROXICET) 5-325 MG per tablet 1 tablet (0 tablets Oral Hold 01/15/17 0545)     Initial Impression / Assessment and Plan / ED Course  I have reviewed the triage vital signs and the nursing notes.  Pertinent labs & imaging results that were available during my care of the patient were reviewed by me and considered in my medical decision making (see chart for details).      Patient presents with epigastric pain.  Nontoxic appearing on exam.  Vital signs are reassuring.  No significant reproducible tenderness on exam.  Considerations include GERD, cholelithiasis, cholecystitis.  Pain is mostly at night.  She fits the profile for gallbladder disease.  Lab work obtained with mild elevation in AST and ALT.  Mild leukocytosis.  Patient was given oral Percocet.  Ultrasound was obtained.  It does show evidence of cholelithiasis without evidence of cholecystitis.  Patient is comfortable on recheck and without pain.  Pain likely related to biliary colic.  Given benign exam and only mild elevation in LFTs, would have her follow-up closely with general surgery.  Patient was given strict return precautions.  After history, exam, and medical workup I feel the patient has been appropriately medically screened and is safe for discharge home. Pertinent diagnoses  were discussed with the patient. Patient was given return precautions.   Final Clinical Impressions(s) / ED Diagnoses   Final diagnoses:  Epigastric pain  Biliary colic    ED Discharge Orders        Ordered    oxyCODONE-acetaminophen (PERCOCET/ROXICET) 5-325 MG tablet  Every 6 hours PRN     01/15/17 0737       Shon Baton, MD 01/15/17 [phone]

## 2017-02-04 ENCOUNTER — Ambulatory Visit (INDEPENDENT_AMBULATORY_CARE_PROVIDER_SITE_OTHER): Payer: Self-pay | Admitting: Physician Assistant

## 2017-02-04 ENCOUNTER — Other Ambulatory Visit: Payer: Self-pay

## 2017-02-04 ENCOUNTER — Encounter (INDEPENDENT_AMBULATORY_CARE_PROVIDER_SITE_OTHER): Payer: Self-pay | Admitting: Physician Assistant

## 2017-02-04 VITALS — BP 102/67 | HR 81 | Temp 97.9°F | Ht <= 58 in | Wt 132.6 lb

## 2017-02-04 DIAGNOSIS — G8929 Other chronic pain: Secondary | ICD-10-CM

## 2017-02-04 DIAGNOSIS — R1013 Epigastric pain: Secondary | ICD-10-CM

## 2017-02-04 DIAGNOSIS — N390 Urinary tract infection, site not specified: Secondary | ICD-10-CM

## 2017-02-04 DIAGNOSIS — K8000 Calculus of gallbladder with acute cholecystitis without obstruction: Secondary | ICD-10-CM

## 2017-02-04 DIAGNOSIS — R8281 Pyuria: Secondary | ICD-10-CM

## 2017-02-04 LAB — POCT URINALYSIS DIPSTICK
BILIRUBIN UA: NEGATIVE
Glucose, UA: NEGATIVE
KETONES UA: NEGATIVE
NITRITE UA: NEGATIVE
PH UA: 5.5 (ref 5.0–8.0)
Protein, UA: NEGATIVE
Spec Grav, UA: 1.02 (ref 1.010–1.025)
Urobilinogen, UA: 0.2 E.U./dL

## 2017-02-04 MED ORDER — ACETAMINOPHEN 500 MG PO TABS: 500.0000 mg | ORAL_TABLET | Freq: Three times a day (TID) | ORAL | 0 refills | Status: AC | PRN

## 2017-02-04 MED ORDER — AMOXICILLIN-POT CLAVULANATE 875-125 MG PO TABS: 1.0000 | ORAL_TABLET | Freq: Two times a day (BID) | ORAL | 0 refills | Status: AC

## 2017-02-04 MED ORDER — URSODIOL 500 MG PO TABS: 250.0000 mg | ORAL_TABLET | Freq: Two times a day (BID) | ORAL | 11 refills | Status: AC | PRN

## 2017-02-04 NOTE — Progress Notes (Signed)
Subjective:  Patient ID: Tabitha Shepard, female    DOB: 12/22/1985  Age: 31 y.o. MRN: [id number]  CC:   HPI Tabitha Shepard is a 31 y.o. female with no significant medical history presents on f/u of ED visit 20 days ago. Diagnosed with cholelithiasis without cholecystitis. Says she would like to have surgery for removal of the gallbladder but can not afford the procedure. Asks if there is alternative to surgery. She continues with occasional epigastric pain that radiates to the back but says the symptoms are much diminished compared to the day she went to the ED. No symptoms currently. Does not endorse CP, palpitations, SOB, HA, hematochezia, rash, or GU sxs. Incidentally noted at the ED was small urinary leukocytes.        Outpatient Medications Prior to Visit  Medication Sig Dispense Refill  . ibuprofen (ADVIL,MOTRIN) 600 MG tablet Take 1 tablet (600 mg total) by mouth every 6 (six) hours. (Patient not taking: Reported on 01/15/2017) 30 tablet 0  . oxyCODONE-acetaminophen (PERCOCET/ROXICET) 5-325 MG tablet Take 1-2 tablets every 6 (six) hours as needed by mouth for severe pain. (Patient not taking: Reported on 02/04/2017) 10 tablet 0   No facility-administered medications prior to visit.      ROS Review of Systems  Constitutional: Negative for chills, fever and malaise/fatigue.  Eyes: Negative for blurred vision.  Respiratory: Negative for shortness of breath.   Cardiovascular: Negative for chest pain and palpitations.  Gastrointestinal: Negative for abdominal pain and nausea.  Genitourinary: Negative for dysuria and hematuria.  Musculoskeletal: Negative for joint pain and myalgias.  Skin: Negative for rash.  Neurological: Negative for tingling and headaches.  Psychiatric/Behavioral: Negative for depression. The patient is not nervous/anxious.     Objective:  BP 102/67 (BP Location: Left Arm, Patient Position: Sitting, Cuff Size: Normal)   Pulse 81   Temp  97.9 F (36.6 C) (Oral)   Ht 4\' 9"  (1.448 m)   Wt 132 lb 9.6 oz (60.1 kg)   LMP 01/07/2017 (Exact Date)   SpO2 97%   Breastfeeding? Yes   BMI 28.69 kg/m   BP/Weight 02/04/2017 01/15/2017 10/21/2016  Systolic BP 102 98 104  Diastolic BP 67 57 61  Wt. (Lbs) 132.6 144 -  BMI 28.69 31.16 -      Physical Exam  Constitutional: She is oriented to person, place, and time.  Well developed, well nourished, NAD, polite  HENT:  Head: Normocephalic and atraumatic.  Eyes: No scleral icterus.  Neck: Normal range of motion. Neck supple. No thyromegaly present.  Cardiovascular: Normal rate, regular rhythm and normal heart sounds.  Pulmonary/Chest: Effort normal and breath sounds normal.  Abdominal: Soft. Bowel sounds are normal. There is tenderness (mild epigastric TTP).  Musculoskeletal: She exhibits no edema.  Neurological: She is alert and oriented to person, place, and time. No cranial nerve deficit. Coordination normal.  Skin: Skin is warm and dry. No rash noted. No erythema. No pallor.  Psychiatric: She has a normal mood and affect. Her behavior is normal. Thought content normal.  Vitals reviewed.    Assessment & Plan:   1. Abdominal pain, chronic, epigastric - H. pylori antibody, IgG - CBC with Differential - Comprehensive metabolic panel - Lipase - Begin acetaminophen (TYLENOL) 500 MG tablet; Take 1 tablet (500 mg total) by mouth every 8 (eight) hours as needed for up to 7 days.  Dispense: 30 tablet; Refill: 0  2. Calculus of gallbladder with acute cholecystitis without obstruction - Begin ursodiol (ACTIGALL) 500 MG tablet;  Take 0.5 tablets (250 mg total) by mouth 2 (two) times daily as needed.  Dispense: 60 tablet; Refill: 11  3. Pyuria - Urinalysis Dipstick positive for moderate leukocytes.  4. UTI - Begin Augmentin (currently breast feeding) - Urine culture   Meds ordered this encounter  Medications  . ursodiol (ACTIGALL) 500 MG tablet    Sig: Take 0.5 tablets (250  mg total) by mouth 2 (two) times daily as needed.    Dispense:  60 tablet    Refill:  11    Order Specific Question:   Supervising Provider    Answer:   Quentin AngstJEGEDE, OLUGBEMIGA E [id number]  . acetaminophen (TYLENOL) 500 MG tablet    Sig: Take 1 tablet (500 mg total) by mouth every 8 (eight) hours as needed for up to 7 days.    Dispense:  30 tablet    Refill:  0    Order Specific Question:   Supervising Provider    Answer:   Quentin AngstJEGEDE, OLUGBEMIGA E [[id number]]    Follow-up: Return in about 4 weeks (around 03/04/2017) for full physical.   Loletta Specteroger David Gomez PA

## 2017-02-04 NOTE — Patient Instructions (Signed)
Colelitiasis  (Cholelithiasis)  La colelitiasis (tambin llamada clculos en la vescula) es una enfermedad de la vescula. La vescula es un rgano pequeo que ayuda a digerir las grasas. Los sntomas de clculos en la vescula son:   Ganas de vomitar (nuseas).   Devolver la comida (vomitar).   Dolor en el vientre.   Piel amarilla (ictericia)   Dolor sbito. Podr sentir el dolor durante algunos minutos o unas horas.   Fiebre.   Dolor a la palpacin.  CUIDADOS EN EL HOGAR   Tome slo los medicamentos segn le haya indicado el mdico.   Siga una dieta baja en grasas hasta que vuelva a ver al mdico nuevamente. Consumir grasas puede causar dolor.   Concurra a las consultas de control con el mdico, segn las indicaciones. Los ataques generalmente ocurren repetidas veces. Generalmente se requiere de una ciruga como tratamiento permanente.    SOLICITE AYUDA DE INMEDIATO SI:   El dolor empeora.   El dolor no se alivia con los medicamentos.   Tiene fiebre o sntomas que persisten durante ms de 2-3 das.   Tiene fiebre y los sntomas empeoran repentinamente.   Siente nuseas y vomita.    ASEGRESE DE QUE:   Comprende estas instrucciones.   Controlar su afeccin.   Recibir ayuda de inmediato si no mejora o si empeora.    Esta informacin no tiene como fin reemplazar el consejo del mdico. Asegrese de hacerle al mdico cualquier pregunta que tenga.  Document Released: 05/25/2008 Document Revised: 10/29/2012 Document Reviewed: 08/20/2012  Elsevier Interactive Patient Education  2017 Elsevier Inc.

## 2017-02-06 LAB — COMPREHENSIVE METABOLIC PANEL
ALBUMIN: 4.3 g/dL (ref 3.5–5.5)
ALT: 17 IU/L (ref 0–32)
AST: 15 IU/L (ref 0–40)
Albumin/Globulin Ratio: 1.4 (ref 1.2–2.2)
Alkaline Phosphatase: 99 IU/L (ref 39–117)
BILIRUBIN TOTAL: 0.3 mg/dL (ref 0.0–1.2)
BUN / CREAT RATIO: 18 (ref 9–23)
BUN: 9 mg/dL (ref 6–20)
CALCIUM: 9.5 mg/dL (ref 8.7–10.2)
CHLORIDE: 104 mmol/L (ref 96–106)
CO2: 23 mmol/L (ref 20–29)
CREATININE: 0.49 mg/dL — AB (ref 0.57–1.00)
GFR calc non Af Amer: 128 mL/min/{1.73_m2} (ref >59)
GFR, EST AFRICAN AMERICAN: 148 mL/min/{1.73_m2} (ref >59)
GLUCOSE: 75 mg/dL (ref 65–99)
Globulin, Total: 3 g/dL (ref 1.5–4.5)
Potassium: 3.9 mmol/L (ref 3.5–5.2)
SODIUM: 142 mmol/L (ref 134–144)
Total Protein: 7.3 g/dL (ref 6.0–8.5)

## 2017-02-06 LAB — CBC WITH DIFFERENTIAL/PLATELET
BASOS ABS: 0 10*3/uL (ref 0.0–0.2)
Basos: 0 %
EOS (ABSOLUTE): 0.2 10*3/uL (ref 0.0–0.4)
Eos: 2 %
Hematocrit: 43.7 % (ref 34.0–46.6)
Hemoglobin: 14.7 g/dL (ref 11.1–15.9)
IMMATURE GRANS (ABS): 0.1 10*3/uL (ref 0.0–0.1)
IMMATURE GRANULOCYTES: 1 %
LYMPHS: 33 %
Lymphocytes Absolute: 2.7 10*3/uL (ref 0.7–3.1)
MCH: 29.6 pg (ref 26.6–33.0)
MCHC: 33.6 g/dL (ref 31.5–35.7)
MCV: 88 fL (ref 79–97)
MONOS ABS: 0.5 10*3/uL (ref 0.1–0.9)
Monocytes: 7 %
NEUTROS PCT: 57 %
Neutrophils Absolute: 4.8 10*3/uL (ref 1.4–7.0)
PLATELETS: 324 10*3/uL (ref 150–379)
RBC: 4.96 x10E6/uL (ref 3.77–5.28)
RDW: 13.5 % (ref 12.3–15.4)
WBC: 8.3 10*3/uL (ref 3.4–10.8)

## 2017-02-06 LAB — URINE CULTURE: Organism ID, Bacteria: NO GROWTH

## 2017-02-06 LAB — LIPASE: LIPASE: 40 U/L (ref 14–72)

## 2017-02-06 LAB — H. PYLORI ANTIBODY, IGG: H. pylori, IgG AbS: <0.8 Index Value (ref 0.00–0.79)

## 2017-02-07 ENCOUNTER — Telehealth (INDEPENDENT_AMBULATORY_CARE_PROVIDER_SITE_OTHER): Payer: Self-pay

## 2017-02-07 NOTE — Telephone Encounter (Signed)
Using pacific interpreters Cristal DeerChristopher [phone]) patient provided with negative/normal lab results and patient expressed understanding. Maryjean Mornempestt S Roberts, CMA

## 2017-02-07 NOTE — Telephone Encounter (Signed)
-----   Message from Loletta Specteroger David Gomez, PA-C sent at 02/06/2017  8:36 AM EST ----- All results negative/normal.

## 2017-03-06 ENCOUNTER — Ambulatory Visit (INDEPENDENT_AMBULATORY_CARE_PROVIDER_SITE_OTHER): Payer: No Typology Code available for payment source | Admitting: Physician Assistant

## 2017-03-27 ENCOUNTER — Ambulatory Visit: Payer: Self-pay | Attending: Physician Assistant

## 2019-05-05 ENCOUNTER — Other Ambulatory Visit: Payer: Self-pay

## 2019-05-05 ENCOUNTER — Ambulatory Visit
Admission: RE | Admit: 2019-05-05 | Discharge: 2019-05-05 | Disposition: A | Discharge status: Home / Self Care | Payer: No Typology Code available for payment source | Source: Ambulatory Visit | Attending: Nurse Practitioner | Admitting: Nurse Practitioner

## 2019-05-05 ENCOUNTER — Other Ambulatory Visit: Payer: Self-pay | Admitting: Nurse Practitioner

## 2019-05-05 DIAGNOSIS — R0989 Other specified symptoms and signs involving the circulatory and respiratory systems: Secondary | ICD-10-CM

## 2020-12-04 IMAGING — CR DG CHEST 2V
2 series · 2 of 2 positions shown · non-contrast
Comparison: None.

CLINICAL DATA: Rhonchi, COVID positive 3 weeks ago

EXAM:
CHEST - 2 VIEW

[w chest pa]
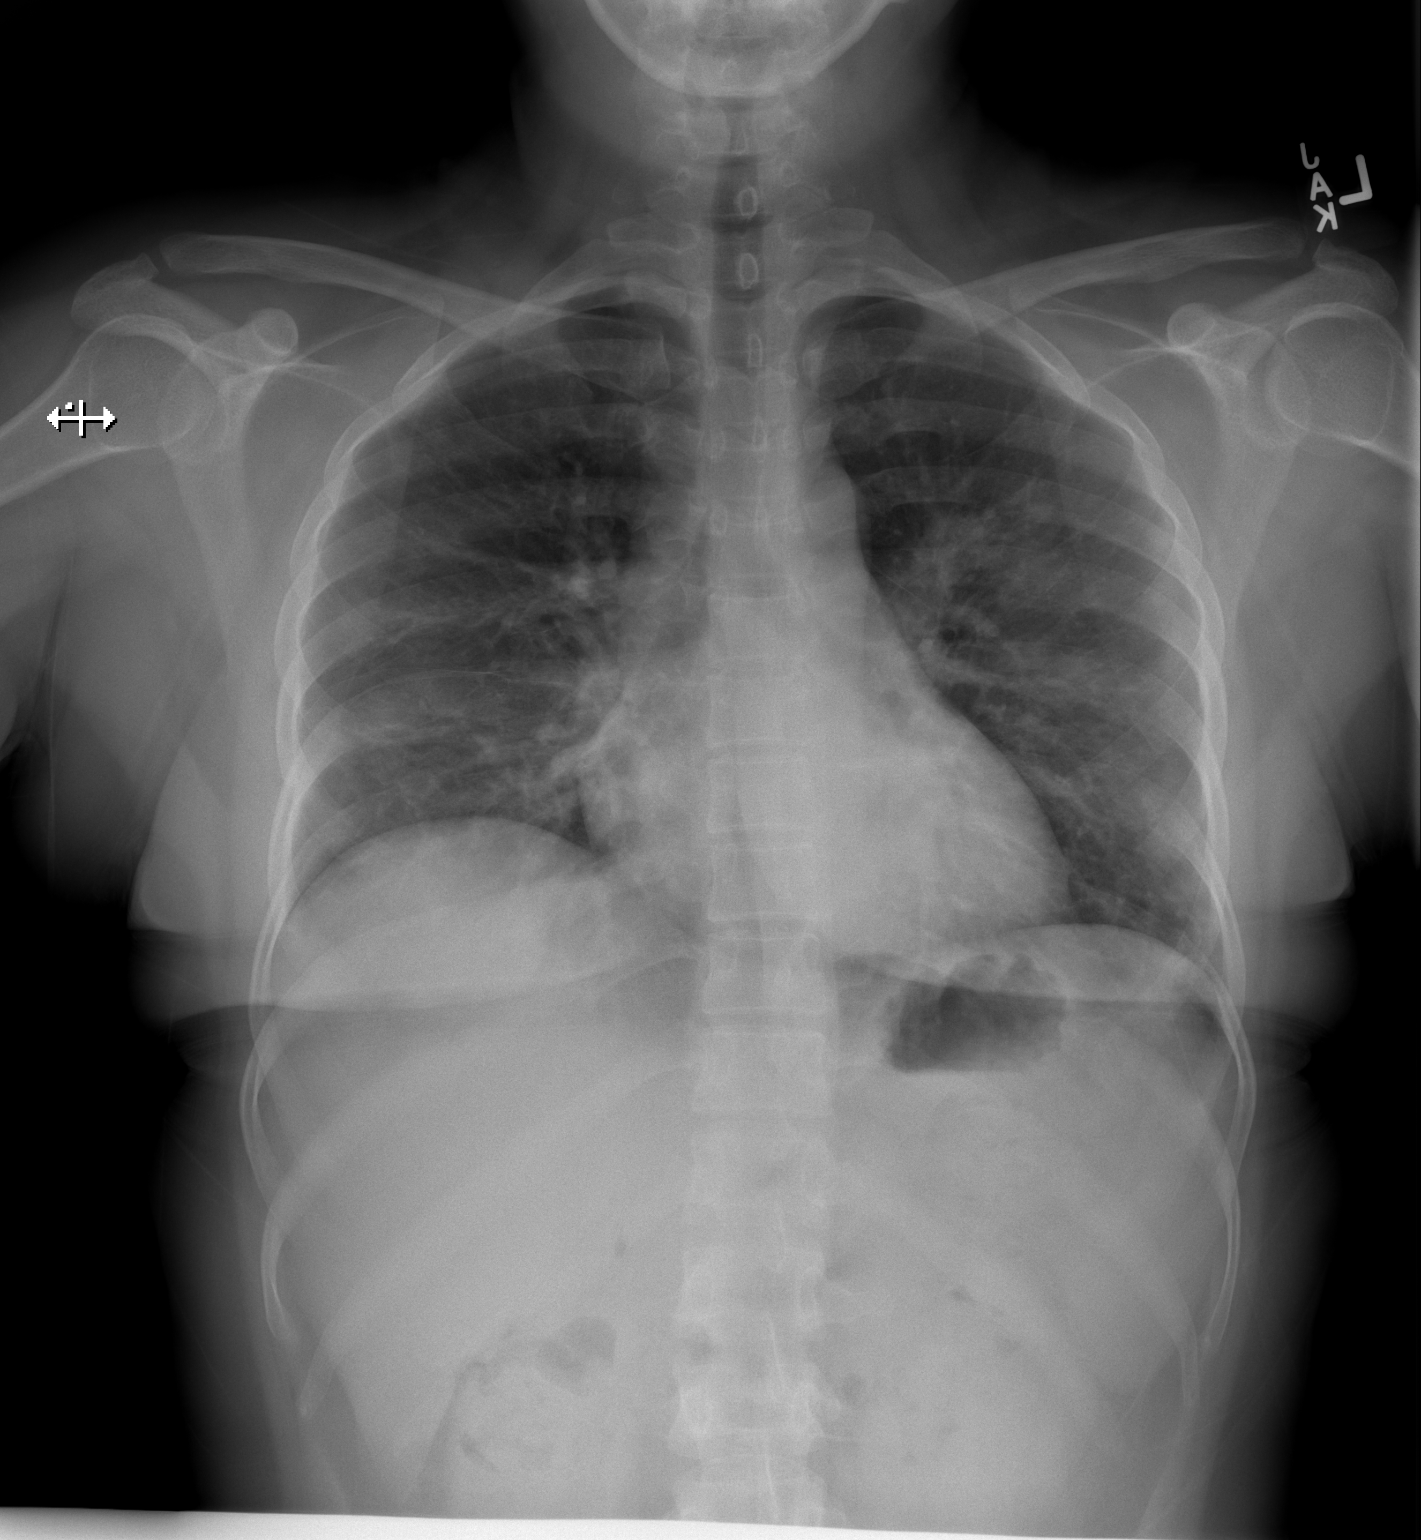

[w chest lat]
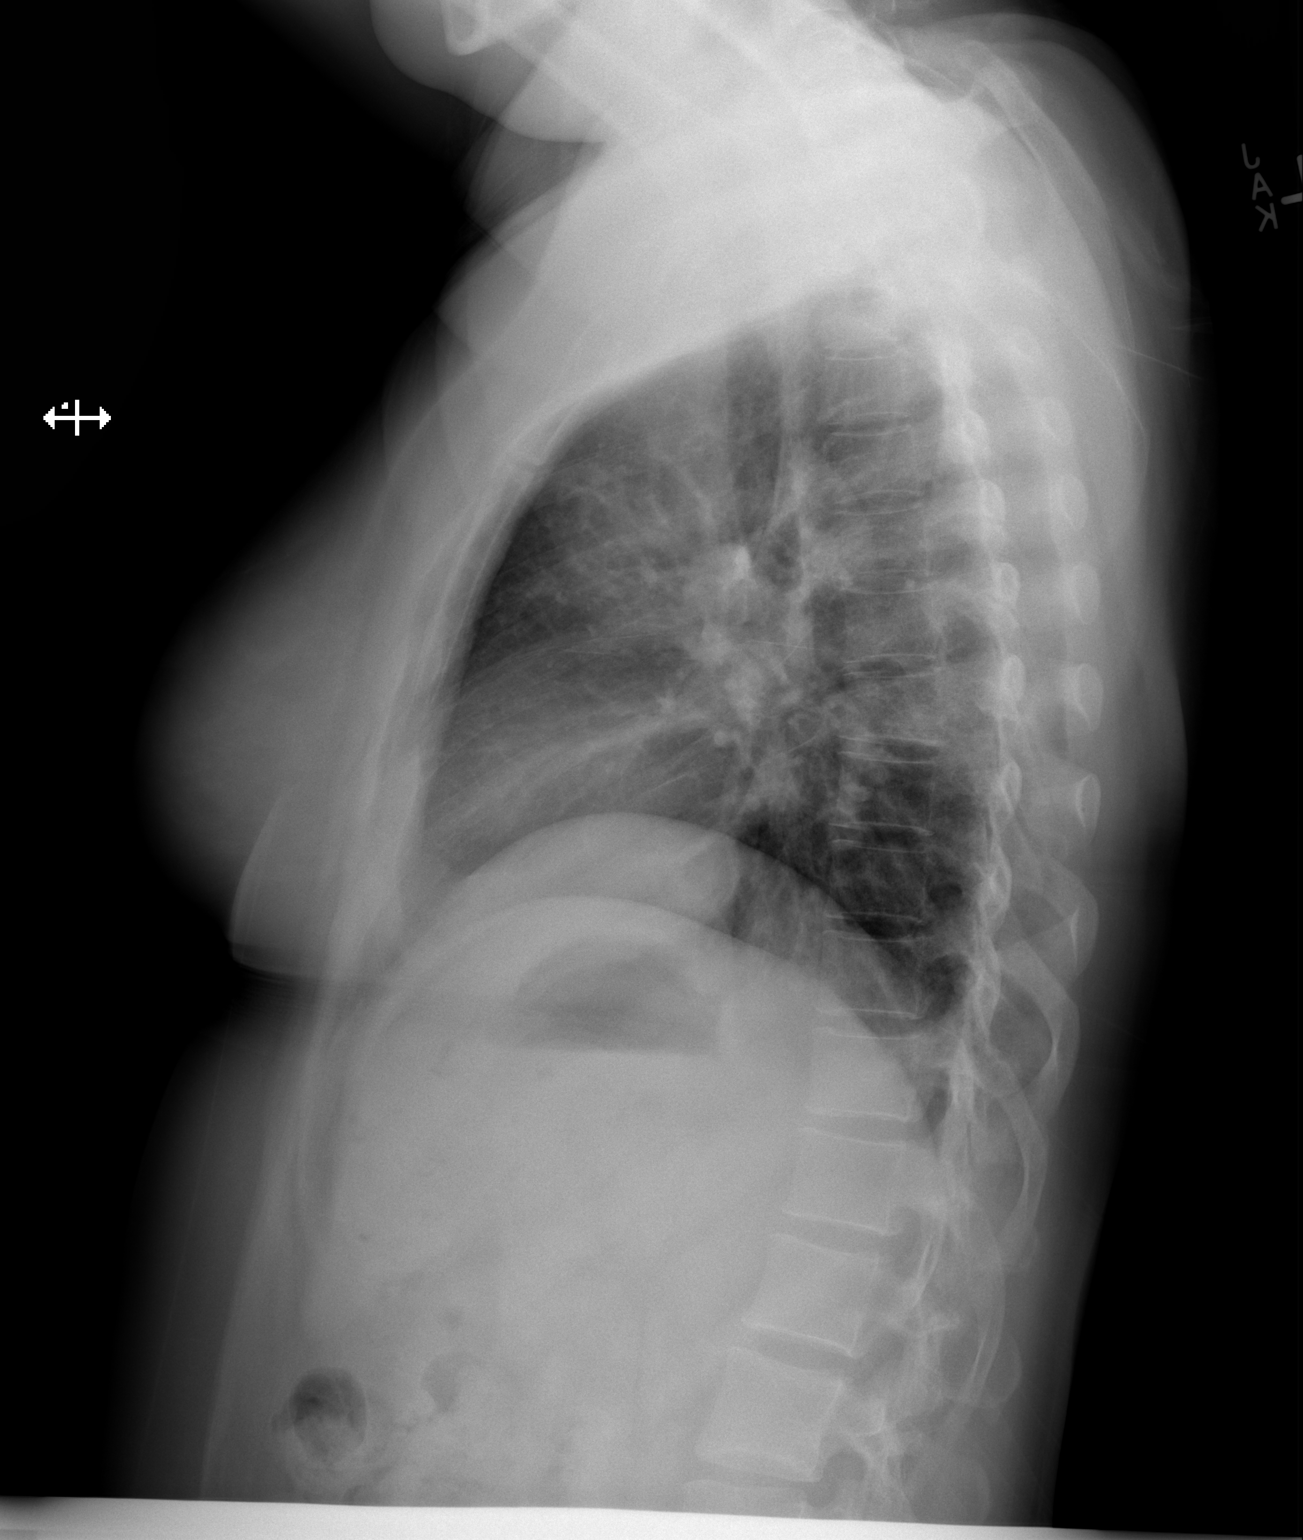

[2 of 2 positions shown; findings below may reference images not displayed]

FINDINGS: The heart size and mediastinal contours are within normal limits.
Faint, heterogeneous airspace opacity of the bilateral lungs, most
conspicuous in the bases. The visualized skeletal structures are
unremarkable.
IMPRESSION: Faint, heterogeneous airspace opacity of the bilateral lungs, most
conspicuous in the bases, generally in keeping with [id number]
airspace disease.
